# Patient Record
Sex: Male | Born: 1985 | Hispanic: No | Marital: Single | Smoking: Current some day smoker
Health system: Southern US, Community
[De-identification: ages and names within clinical notes are randomized; demographics above are authoritative.]

## PROBLEM LIST (undated history)

## (undated) DIAGNOSIS — M549 Dorsalgia, unspecified: Secondary | ICD-10-CM

## (undated) DIAGNOSIS — F419 Anxiety disorder, unspecified: Secondary | ICD-10-CM

## (undated) HISTORY — PX: CIRCUMCISION: SUR203

## (undated) HISTORY — DX: Dorsalgia, unspecified: M54.9

## (undated) HISTORY — PX: ANTERIOR CRUCIATE LIGAMENT REPAIR: SHX115

## (undated) HISTORY — PX: KNEE CARTILAGE SURGERY: SHX688

---
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
## 2013-07-08 ENCOUNTER — Ambulatory Visit: Payer: Self-pay | Admitting: Family Medicine

## 2013-07-08 VITALS — BP 130/80 | HR 96 | Temp 101.2°F | Resp 16 | Ht 74.0 in | Wt 177.0 lb

## 2013-07-08 DIAGNOSIS — R5383 Other fatigue: Secondary | ICD-10-CM

## 2013-07-08 DIAGNOSIS — R5381 Other malaise: Secondary | ICD-10-CM

## 2013-07-08 DIAGNOSIS — R112 Nausea with vomiting, unspecified: Secondary | ICD-10-CM

## 2013-07-08 MED ORDER — PROMETHAZINE HCL 25 MG PO TABS: 25.0000 mg | ORAL_TABLET | Freq: Three times a day (TID) | ORAL | Status: DC | PRN

## 2013-07-08 NOTE — Patient Instructions (Signed)

## 2013-07-08 NOTE — Progress Notes (Signed)
   Subjective:    Patient ID: Jon Obrien, male    DOB: 06-13-1985, 28 y.o.   MRN: [id number] This chart was scribed for Elvina SidleKurt Noella Kipnis, MD by Valera CastleSteven Perry, ED Scribe. This patient was seen in room 05 and the patient's care was started at 5:40 PM.  Chief Complaint  Patient presents with  . Nausea    vomiting, nausea diarrhea fever, sore throat   HPI Jon Obrien is a 28 y.o. male Pt presents with nausea, vomiting, diarrhea, fever, sore throat, and headache, onset yesterday afternoon. [facility]   He reports his symptoms started with weakness, so he took Vitamin C and took a nap. He states he woke up that evening vomiting. This morning he ate breakfast, took Ibuprofen, but vomited his meal back up. He states his fever, headache, started soon after. He has been able to keep some water down, but has not eaten much today.   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 He is originally from [place].   PCP - No primary provider on file.  Review of Systems  Constitutional: Positive for fever.  HENT: Positive for sore throat.   Gastrointestinal: Positive for nausea, vomiting and diarrhea.  Neurological: Positive for weakness and headaches.    There are no active problems to display for this patient.  Prior to Admission medications   Medication Sig Start Date End Date Taking? Authorizing Provider  anti-nausea (EMETROL) solution Take 10 mLs by mouth every 15 (fifteen) minutes as needed for nausea or vomiting.   Yes Historical Provider, MD  ibuprofen (ADVIL,MOTRIN) 200 MG tablet Take 200 mg by mouth every 6 (six) hours as needed.   Yes Historical Provider, MD   BP 130/80  Pulse 96  Temp(Src) 101.2 F (38.4 C) (Oral)  Resp 16  Ht 6\' 2"  (1.88 m)  Wt 177 lb (80.287 kg)  BMI 22.72 kg/m2  SpO2 96%     Objective:   Physical Exam CONSTITUTIONAL: Well developed/well nourished HEAD: Normocephalic/atraumatic EYES: EOMI/PERRL ENMT: Mucous  membranes moist NECK: supple no meningeal signs SPINE:entire spine nontender CV: S1/S2 noted, no murmurs/rubs/gallops noted LUNGS: Lungs are clear to auscultation bilaterally, no apparent distress ABDOMEN: soft, nontender, no rebound or guarding GU:no cva tenderness NEURO: Pt is awake/alert, moves all extremitiesx4 EXTREMITIES: pulses normal, full ROM SKIN: warm, color normal PSYCH: no abnormalities of mood noted Abdomen is soft without HSM, no masses, no guarding or rebound No jaundice Chest clear Heart: Regular, no murmur    Assessment & Plan:    Nausea with vomiting - Plan: promethazine (PHENERGAN) 25 MG tablet  Malaise  Signed, Elvina SidleKurt Krislynn Gronau, MD      I personally performed the services described in this documentation, which was scribed in my presence. The recorded information has been reviewed and is accurate.

## 2014-06-28 ENCOUNTER — Ambulatory Visit (INDEPENDENT_AMBULATORY_CARE_PROVIDER_SITE_OTHER): Payer: Self-pay | Admitting: Family Medicine

## 2014-06-28 VITALS — BP 116/62 | HR 66 | Temp 97.7°F | Resp 18 | Ht 75.0 in | Wt 182.0 lb

## 2014-06-28 DIAGNOSIS — M546 Pain in thoracic spine: Secondary | ICD-10-CM

## 2014-06-28 MED ORDER — CYCLOBENZAPRINE HCL 5 MG PO TABS: 5.0000 mg | ORAL_TABLET | Freq: Three times a day (TID) | ORAL | Status: DC | PRN

## 2014-06-28 MED ORDER — PREDNISONE 20 MG PO TABS: 40.0000 mg | ORAL_TABLET | Freq: Every day | ORAL | Status: DC

## 2014-06-28 NOTE — Patient Instructions (Signed)

## 2014-06-28 NOTE — Progress Notes (Signed)
This chart was scribed for Jon SidleKurt Quintessa Simmerman, MD by Luisa DagoPriscilla Tutu, ED Scribe. This patient was seen in room 1 and the patient's care was started at 4:14 PM.  Chief Complaint  Patient presents with  . Back Pain    upper back pain has hx of spasms today      This is a 28 y.o.male who complains of sudden onset upper back pain that started at 12:30 PM Character of pain: He describes the pain as sharp in nature Location of pain:  Bilateral paraspinal muscle pain Radiation of pain:  Pain does not radiate Onset associated with:  No associated symptoms Patient has a past history of low back pain for which   He states that 6 months ago he was seen at the ED for similar back pain and he was diagnosed with muscle spasms and prescribed muscle relaxants. Pt does not recall any recent injuries. 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 He also reports doing therapeutic exercising in the hopes of relieving his back pain, but he has not been successful.   Jon Obrien denies any urinary symptoms, bowel problems, numbness in the legs, loss of motor power. Jon Obrien had no fever.   Past Medical History  Diagnosis Date  . Back pain      Past Surgical History  Procedure Laterality Date  . Circumcision      Objective: 29 y.o  male in no acute distress. Blood pressure 116/62, pulse 66, temperature 97.7 F (36.5 C), temperature source Oral, resp. rate 18, height 6\' 3"  (1.905 m), weight 182 lb (82.555 kg), SpO2 98 %.Body mass index is 22.75 kg/(m^2). Palpation of the back reveals Mild medial tenderness to bilateral scapula CVA:  nontender Abdomen: soft, nontender. No rebound. No guarding Peripheral pulses:  DP/PT 2+ Inspection of the back: Reveals no scoliosis.  Straight-leg raising: negative Motor exam of lower extremity: No abnormal weakness. Reflexes: Symmetric and normal Skin exam: no rash, now redness, no wounds noted.   Assessment/Plan:  This chart was scribed in my presence  and reviewed by me personally.    ICD-9-CM ICD-10-CM   1. Bilateral thoracic back pain 724.1 M54.6 cyclobenzaprine (FLEXERIL) 5 MG tablet     predniSONE (DELTASONE) 20 MG tablet     Signed, Jon SidleKurt Abou Sterkel, MD

## 2014-07-26 ENCOUNTER — Ambulatory Visit (INDEPENDENT_AMBULATORY_CARE_PROVIDER_SITE_OTHER): Payer: Self-pay | Admitting: Family Medicine

## 2014-07-26 VITALS — BP 147/73 | HR 94 | Temp 98.3°F | Resp 16 | Ht 74.0 in | Wt 177.4 lb

## 2014-07-26 DIAGNOSIS — F4310 Post-traumatic stress disorder, unspecified: Secondary | ICD-10-CM

## 2014-07-26 MED ORDER — ALPRAZOLAM 0.25 MG PO TABS: 0.2500 mg | ORAL_TABLET | Freq: Two times a day (BID) | ORAL | Status: AC | PRN

## 2014-07-26 NOTE — Patient Instructions (Addendum)
Call Dr. Juliann Pulseortch L. Mann [address] [place]   [phone]    Post-traumatic Stress You have post-traumatic stress disorder (PTSD). This condition causes many different symptoms including: emotional outbursts, anxiety, sleeping problems, social withdrawal, and drug abuse. PTSD often follows a particularly traumatic event such as war, or natural disasters like hurricanes, earthquakes, or floods. It can also be seen after personal traumas such as accidents, rape, or the death of someone you love. Symptoms may be delayed for days or even years. Emotional numbing and the inability to feel your emotions, may be the earliest sign. Periods of agitation, aggression, and inability to perform ordinary tasks are common with PTSD. Nightmares and daytime memories of the trauma often bring on uncontrolled symptoms. Sufferers typically startle easily and avoid reminders of the trauma. Panic attacks, feelings of extreme guilt, and blackouts are often reported. Treatment is very helpful, especially group therapy. Healing happens when emotional traumas are shared with others who have a sympathetic ear. The [facility] have helped over 185,000 veterans with this problem. Medication is also very effective. The symptoms can become chronic and lifelong, so it is important to get help. Call your caregiver or a counselor who deals with this type of problem for further assistance. Document Released: 05/24/2004 Document Revised: 07/09/2011 Document Reviewed: 04/16/2005 [facility] Patient Information 2015 [place]. This information is not intended to replace advice given to you by your health care provider. Make sure you discuss any questions you have with your health care provider.

## 2014-07-26 NOTE — Progress Notes (Signed)
° °  Subjective:  This chart was scribed for Jon SidleKurt Lauenstein, MD by Modena JanskyAlbert Obrien, ED Scribe. This patient was seen in room 4 and the patient's care was started at 7:36 PM.   Patient ID: Jon Obrien, male    DOB: March 11, 1986, 29 y.o.   MRN: [id number] Chief Complaint  Patient presents with   Dizziness   Nausea   Anxiety   Fatigue   HPI HPI Comments: Jon Obrien is a 29 y.o. male who presents to the [facility] complaining of multiple symptoms.   He reports that the past couple of weeks he has intermittent episodes of feeling "unusual and detached". He describes his episodes of his head feeling foggy, draining, and dizzy. He reports that during these episodes he gets nervous, frustrated, and angry. He states that within the last 2-3 days, his episodes have worsened.   He states that has also been having nausea.   He reports that he has been feeling stressed.   He states that he has a prior hx of these symptoms when he was at the age of 29. He reports that then he was exposed to traumatic events from war in his country at that age.   He denies any sleep disturbance, SI, or hallucinations.   He reports no hx of drug use. 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  Past Medical History  Diagnosis Date   Back pain    Past Surgical History  Procedure Laterality Date   Circumcision     No Known Allergies Prior to Admission medications   Not on File   History   Social History   Marital Status: Single    Spouse Name: N/A   Number of Children: N/A   Years of Education: N/A   Occupational History   Not on file.   Social History Main Topics   Smoking status: Never Smoker    Smokeless tobacco: Not on file   Alcohol Use: Not on file   Drug Use: Not on file   Sexual Activity: Not on file   Other Topics  Concern   Not on file   Social History Narrative     Review of Systems  Gastrointestinal: Positive for nausea.  Neurological: Positive for dizziness.  Psychiatric/Behavioral: Negative for suicidal ideas, hallucinations and sleep disturbance. The patient is nervous/anxious.        Objective:   Physical Exam I spent 20 minutes to the patient about posttraumatic stress disorder.  Patient's having feelings of detachment, disconnectedness, and depersonalization. He's having episodes reveals a crying and then uncontrollable rage   BP 147/73 mmHg   Pulse 94   Temp(Src) 98.3 F (36.8 C) (Oral)   Resp 16   Ht 6\' 2"  (1.88 m)   Wt 177 lb 6.4 oz (80.468 kg)   BMI 22.77 kg/m2   SpO2 98%    Assessment & Plan:   This chart was scribed in my presence and reviewed by me personally.    ICD-9-CM ICD-10-CM   1. PTSD (post-traumatic stress disorder) 309.81 F43.10 ALPRAZolam (XANAX) 0.25 MG tablet   Patient referred to Dr. Davy Piqueortch Obrien here in town  (please see the patient education handout)  Signed, Jon SidleKurt Lauenstein, MD

## 2014-07-28 ENCOUNTER — Telehealth: Payer: Self-pay | Admitting: Family Medicine

## 2014-07-28 NOTE — Telephone Encounter (Signed)
Patient called and asked to speak with "Dr. Kenyon AnaKurt". Wants a call back. Would give information.   [phone]

## 2014-07-28 NOTE — Telephone Encounter (Signed)
I do not know what to do with this message

## 2014-07-28 NOTE — Telephone Encounter (Signed)
Pt states Dr L gave him a # for psychiatrist. Fredna DowSays it was wrong #.

## 2014-07-28 NOTE — Telephone Encounter (Signed)
Pt states the # you gave him for Dr Loreta AveMann is not right #. I looked # up on internet and I got the same # you gave him. Do you know what else to do?

## 2015-09-10 ENCOUNTER — Ambulatory Visit (INDEPENDENT_AMBULATORY_CARE_PROVIDER_SITE_OTHER): Payer: Self-pay | Admitting: Emergency Medicine

## 2015-09-10 VITALS — BP 114/74 | HR 77 | Temp 97.8°F | Resp 16 | Ht 74.0 in | Wt 190.2 lb

## 2015-09-10 DIAGNOSIS — J0100 Acute maxillary sinusitis, unspecified: Secondary | ICD-10-CM

## 2015-09-10 DIAGNOSIS — J029 Acute pharyngitis, unspecified: Secondary | ICD-10-CM

## 2015-09-10 DIAGNOSIS — R0982 Postnasal drip: Secondary | ICD-10-CM

## 2015-09-10 LAB — POCT RAPID STREP A (OFFICE): Rapid Strep A Screen: NEGATIVE

## 2015-09-10 MED ORDER — AMOXICILLIN 875 MG PO TABS: 875.0000 mg | ORAL_TABLET | Freq: Two times a day (BID) | ORAL | Status: DC

## 2015-09-10 NOTE — Progress Notes (Addendum)
Patient ID: Jon Obrien, male   DOB: 1985-07-09, 30 y.o.   MRN: [id number]    By signing my name below, I, Essence Howell, attest that this documentation has been prepared under the direction and in the presence of Collene GobbleSteven A Agnes Probert, MD Electronically Signed: Charline BillsEssence Howell, ED Scribe 09/10/2015 at 8:49 AM.  Chief Complaint:  Chief Complaint  Patient presents with  . runny nose    x 2 days  . Sore Throat    x 2 days  . Headache    x 2 days   HPI: Jon Obrien is a 30 y.o. male [facility] complaining of intermittent HA for the past 2 days. Pt reports associated symptoms of rhinorrhea x 2 days, postnasal drip, productive cough with yellow sputum and sore throat onset this morning that is exacerbated with swallowing. Pt's nephew is sick with strep throat. He has tried Robitussin without significant relief. He denies fever. Pt reports h/o environmental allergies. No h/o HTN or DM. No known drug allergies.   Past Medical History  Diagnosis Date  . Back pain    Past Surgical History  Procedure Laterality Date  . Circumcision     Social History   Social History  . Marital Status: Single    Spouse Name: N/A  . Number of Children: N/A  . Years of Education: N/A   Social History Main Topics  . Smoking status: Never Smoker   . Smokeless tobacco: None  . Alcohol Use: None  . Drug Use: None  . Sexual Activity: Not Asked   Other Topics Concern  . None   Social History Narrative   Family History  Problem Relation Age of Onset  . Hyperlipidemia Mother   . Cancer Paternal Grandmother    No Known Allergies Prior to Admission medications   Medication Sig Start Date End Date Taking? Authorizing Provider  ALPRAZolam (XANAX) 0.25 MG tablet Take 1 tablet (0.25 mg total) by mouth 2 (two) times daily as needed for anxiety. Patient not taking: Reported on 09/10/2015 07/26/14   Elvina SidleKurt Lauenstein, MD   ROS: The patient denies fevers, chills, night sweats, unintentional weight  loss, chest pain, palpitations, wheezing, dyspnea on exertion, nausea, vomiting, abdominal pain, dysuria, hematuria, melena, numbness, weakness, or tingling.   All other systems have been reviewed and were otherwise negative with the exception of those mentioned in the HPI and as above.    PHYSICAL EXAM: Filed Vitals:   09/10/15 0817  BP: 114/74  Pulse: 77  Temp: 97.8 F (36.6 C)  Resp: 16   Body mass index is 24.41 kg/(m^2).  General: Alert, no acute distress HEENT:  Normocephalic, atraumatic, oropharynx patent. Posterior pharynx is very red. Tonsils are not enlarged. TMs are clear.  Eye: Nonie HoyerOMI, [facility] Cardiovascular: Regular rate and rhythm, no rubs murmurs or gallops. No Carotid bruits, radial pulse intact. No pedal edema.  Respiratory: Clear to auscultation bilaterally. No wheezes, rales, or rhonchi. No cyanosis, no use of accessory musculature Abdominal: No organomegaly, abdomen is soft and non-tender, positive bowel sounds. No masses. Musculoskeletal: Gait intact. No edema, tenderness Skin: No rashes. Neurologic: Facial musculature symmetric. Psychiatric: Patient acts appropriately throughout our interaction. Lymphatic: No cervical or submandibular lymphadenopathy  LABS: Results for orders placed or performed in visit on 09/10/15  POCT rapid strep A  Result Value Ref Range   Rapid Strep A Screen Negative Negative   EKG/XRAY:   Primary [facility]  ASSESSMENT/PLAN:   Patient has had  strep exposure. He has had a yellowish nasal drainage with severe sore throat. Strep screen here was negative. Will treat with amoxicillin twice a day saltwater gargles.I personally performed the services described in this documentation, which was scribed in my presence. The recorded information has been reviewed and is accurateI told the patient if he wanted to wait 24 to  48 hours and see if his symptoms improve without antibiotics that would be very reasonable. He  will give it at least 24 hours and see if his symptoms improve and then if not he will take the antibiotics.Michaell Cowing sideeffects, risk and benefits, and alternatives of medications d/w patient. Patient is aware that all medications have potential sideeffects and we are unable to predict every sideeffect or drug-drug interaction that may occur.  Lesle Chris MD 09/10/2015 8:54 AM

## 2015-09-10 NOTE — Progress Notes (Signed)
Patient ID: Baruch Merlehat Nuon, male   DOB: 06-05-1985, 30 y.o.   MRN: [id number]    By signing my name below, I, Essence Howell, attest that this documentation has been prepared under the direction and in the presence of Collene GobbleSteven A Daub, MD Electronically Signed: Charline BillsEssence Howell, ED Scribe 09/10/2015 at 8:49 AM.  Chief Complaint:  Chief Complaint  Patient presents with   runny nose    x 2 days   Sore Throat    x 2 days   Headache    x 2 days   HPI: Keoni Arnaldo NatalRamadani is a 30 y.o. male who [facility] complaining of intermittent HA for the past 2 days. Pt reports associated symptoms of rhinorrhea x 2 days, postnasal drip, productive cough with yellow sputum and sore throat onset this morning that is exacerbated with swallowing. Pt's nephew is sick with strep throat. He has tried Robitussin without significant relief. He denies fever. Pt reports h/o environmental allergies. No h/o HTN or DM. No known drug allergies.   Past Medical History  Diagnosis Date   Back pain    Past Surgical History  Procedure Laterality Date   Circumcision     Social History   Social History   Marital Status: Single    Spouse Name: N/A   Number of Children: N/A   Years of Education: N/A   Social History Main Topics   Smoking status: Never Smoker    Smokeless tobacco: None   Alcohol Use: None   Drug Use: None   Sexual Activity: Not Asked   Other Topics Concern   None   Social History Narrative   Family History  Problem Relation Age of Onset   Hyperlipidemia Mother    Cancer Paternal Grandmother    No Known Allergies Prior to Admission medications   Medication Sig Start Date End Date Taking? Authorizing Provider  ALPRAZolam (XANAX) 0.25 MG tablet Take 1 tablet (0.25 mg total) by mouth 2 (two) times daily as needed for anxiety. Patient not taking: Reported on 09/10/2015 07/26/14   Elvina SidleKurt Lauenstein, MD   ROS: The patient denies fevers, chills, night sweats, unintentional weight  loss, chest pain, palpitations, wheezing, dyspnea on exertion, nausea, vomiting, abdominal pain, dysuria, hematuria, melena, numbness, weakness, or tingling.   All other systems have been reviewed and were otherwise negative with the exception of those mentioned in the HPI and as above.    PHYSICAL EXAM: Filed Vitals:   09/10/15 0817  BP: 114/74  Pulse: 77  Temp: 97.8 F (36.6 C)  Resp: 16   Body mass index is 24.41 kg/(m^2).  General: Alert, no acute distress HEENT:  Normocephalic, atraumatic, oropharynx patent. Posterior pharynx is very red. Tonsils are not enlarged. TMs are clear.  Eye: Nonie HoyerOMI, [facility] Cardiovascular: Regular rate and rhythm, no rubs murmurs or gallops. No Carotid bruits, radial pulse intact. No pedal edema.  Respiratory: Clear to auscultation bilaterally. No wheezes, rales, or rhonchi. No cyanosis, no use of accessory musculature Abdominal: No organomegaly, abdomen is soft and non-tender, positive bowel sounds. No masses. Musculoskeletal: Gait intact. No edema, tenderness Skin: No rashes. Neurologic: Facial musculature symmetric. Psychiatric: Patient acts appropriately throughout our interaction. Lymphatic: No cervical or submandibular lymphadenopathy  LABS: Results for orders placed or performed in visit on 09/10/15  POCT rapid strep A  Result Value Ref Range   Rapid Strep A Screen Negative Negative   EKG/XRAY:   Primary [facility]  ASSESSMENT/PLAN:   Patient has had  strep exposure. He has had a yellowish nasal drainage with severe sore throat. Strep screen here was negative. Will treat with amoxicillin twice a day saltwater gargles.I personally performed the services described in this documentation, which was scribed in my presence. The recorded information has been reviewed and is accurate.  Gross sideeffects, risk and benefits, and alternatives of medications d/w patient. Patient is aware that all medications have potential  sideeffects and we are unable to predict every sideeffect or drug-drug interaction that may occur.  Lesle Chris MD 09/10/2015 8:19 AM

## 2015-09-10 NOTE — Addendum Note (Signed)
Addended by: Lesle ChrisAUB, Deberah Adolf A on: 09/10/2015 08:59 AM   Modules accepted: Kipp BroodSmartSet

## 2015-09-10 NOTE — Patient Instructions (Addendum)
     IF you received an x-ray today, you will receive an invoice from [facility]. Please contact [facility] at [phone] with questions or concerns regarding your invoice.   IF you received labwork today, you will receive an invoice from [facility]. Please contact [facility] at [phone] with questions or concerns regarding your invoice.   Our billing staff will not be able to assist you with questions regarding bills from these companies.  You will be contacted with the lab results as soon as they are available. The fastest way to get your results is to activate your My Chart account. Instructions are located on the last page of this paperwork. If you have not heard from us regarding the results in 2 weeks, please contact this office.     Sore Throat A sore throat is pain, burning, irritation, or scratchiness of the throat. There is often pain or tenderness when swallowing or talking. A sore throat may be accompanied by other symptoms, such as coughing, sneezing, fever, and swollen neck glands. A sore throat is often the first sign of another sickness, such as a cold, flu, strep throat, or mononucleosis (commonly known as mono). Most sore throats go away without medical treatment. CAUSES  The most common causes of a sore throat include:  A viral infection, such as a cold, flu, or mono.  A bacterial infection, such as strep throat, tonsillitis, or whooping cough.  Seasonal allergies.  Dryness in the air.  Irritants, such as smoke or pollution.  Gastroesophageal reflux disease (GERD). HOME CARE INSTRUCTIONS   Only take over-the-counter medicines as directed by your caregiver.  Drink enough fluids to keep your urine clear or pale yellow.  Rest as needed.  Try using throat sprays, lozenges, or sucking on hard candy to ease any pain (if older than 4 years or as directed).  Sip warm liquids, such as broth, herbal tea, or warm water  with honey to relieve pain temporarily. You may also eat or drink cold or frozen liquids such as frozen ice pops.  Gargle with salt water (mix 1 tsp salt with 8 oz of water).  Do not smoke and avoid secondhand smoke.  Put a cool-mist humidifier in your bedroom at night to moisten the air. You can also turn on a hot shower and sit in the bathroom with the door closed for 5-10 minutes. SEEK IMMEDIATE MEDICAL CARE IF:  You have difficulty breathing.  You are unable to swallow fluids, soft foods, or your saliva.  You have increased swelling in the throat.  Your sore throat does not get better in 7 days.  You have nausea and vomiting.  You have a fever or persistent symptoms for more than 2-3 days.  You have a fever and your symptoms suddenly get worse. MAKE SURE YOU:   Understand these instructions.  Will watch your condition.  Will get help right away if you are not doing well or get worse.   This information is not intended to replace advice given to you by your health care provider. Make sure you discuss any questions you have with your health care provider.   Document Released: 05/24/2004 Document Revised: 05/07/2014 Document Reviewed: 12/23/2011 Elsevier Interactive Patient Education 2016 [employer].  

## 2015-09-11 LAB — CULTURE, GROUP A STREP: Organism ID, Bacteria: NORMAL

## 2017-03-05 ENCOUNTER — Ambulatory Visit: Payer: Self-pay | Admitting: Physician Assistant

## 2017-09-13 ENCOUNTER — Emergency Department (HOSPITAL_COMMUNITY): Payer: Self-pay

## 2017-09-13 ENCOUNTER — Emergency Department (HOSPITAL_COMMUNITY)
Admission: EM | Admit: 2017-09-13 | Discharge: 2017-09-13 | Disposition: A | Discharge status: Home / Self Care | Payer: Self-pay | Attending: Emergency Medicine | Admitting: Emergency Medicine

## 2017-09-13 ENCOUNTER — Encounter (HOSPITAL_COMMUNITY): Payer: Self-pay | Admitting: *Deleted

## 2017-09-13 ENCOUNTER — Other Ambulatory Visit: Payer: Self-pay

## 2017-09-13 DIAGNOSIS — R61 Generalized hyperhidrosis: Secondary | ICD-10-CM | POA: Insufficient documentation

## 2017-09-13 DIAGNOSIS — R0602 Shortness of breath: Secondary | ICD-10-CM | POA: Insufficient documentation

## 2017-09-13 DIAGNOSIS — R0789 Other chest pain: Secondary | ICD-10-CM | POA: Insufficient documentation

## 2017-09-13 DIAGNOSIS — F17290 Nicotine dependence, other tobacco product, uncomplicated: Secondary | ICD-10-CM | POA: Insufficient documentation

## 2017-09-13 DIAGNOSIS — R42 Dizziness and giddiness: Secondary | ICD-10-CM | POA: Insufficient documentation

## 2017-09-13 DIAGNOSIS — R110 Nausea: Secondary | ICD-10-CM | POA: Insufficient documentation

## 2017-09-13 HISTORY — DX: Anxiety disorder, unspecified: F41.9

## 2017-09-13 LAB — BASIC METABOLIC PANEL
Anion gap: 8 (ref 5–15)
BUN: 16 mg/dL (ref 6–20)
CALCIUM: 9.4 mg/dL (ref 8.9–10.3)
CO2: 29 mmol/L (ref 22–32)
Chloride: 106 mmol/L (ref 101–111)
Creatinine, Ser: 1.09 mg/dL (ref 0.61–1.24)
GFR calc Af Amer: >60 mL/min (ref >60)
GFR calc non Af Amer: >60 mL/min (ref >60)
GLUCOSE: 100 mg/dL — AB (ref 65–99)
Potassium: 4.1 mmol/L (ref 3.5–5.1)
Sodium: 143 mmol/L (ref 135–145)

## 2017-09-13 LAB — CBC
HCT: 45.1 % (ref 39.0–52.0)
Hemoglobin: 14.7 g/dL (ref 13.0–17.0)
MCH: 29.2 pg (ref 26.0–34.0)
MCHC: 32.6 g/dL (ref 30.0–36.0)
MCV: 89.7 fL (ref 78.0–100.0)
Platelets: 279 10*3/uL (ref 150–400)
RBC: 5.03 MIL/uL (ref 4.22–5.81)
RDW: 11.8 % (ref 11.5–15.5)
WBC: 7.6 10*3/uL (ref 4.0–10.5)

## 2017-09-13 LAB — I-STAT TROPONIN, ED: Troponin i, poc: 0 ng/mL (ref 0.00–0.08)

## 2017-09-13 LAB — TROPONIN I: Troponin I: <0.03 ng/mL (ref <0.03)

## 2017-09-13 MED ORDER — FAMOTIDINE 20 MG PO TABS: 20.0000 mg | ORAL_TABLET | Freq: Two times a day (BID) | ORAL | 0 refills | Status: DC

## 2017-09-13 NOTE — ED Triage Notes (Signed)
THE PT  WOKE UP 0430 WITH CENTRAL CHEST PAIN LT AND RT  WITH SL NAUSEA  NO SOB OR VOMITING HE HAD A SIMILAR EPISODE ONE WEEK AGO BUT IT WAS FLEETING  HX ANXIETY

## 2017-09-13 NOTE — ED Provider Notes (Signed)
[facility] EMERGENCY DEPARTMENT Provider Note   CSN: [id number] Arrival date & time: 09/13/17  0544     History   Chief Complaint Chief Complaint  Patient presents with  . Chest Pain    HPI Jon Obrien is a 32 y.o. male   The history is provided by the patient.  Chest Pain   This is a new problem. The current episode started 3 to 5 hours ago. The problem has been resolved. The pain is associated with rest. The pain is present in the substernal region. The pain is at a severity of 9/10. The pain is severe. The quality of the pain is described as pressure-like. The pain radiates to the upper back. Duration of episode(s) is 4 hours. The symptoms are aggravated by deep breathing. Associated symptoms include diaphoresis, dizziness, nausea and shortness of breath. Pertinent negatives include no abdominal pain, no back pain, no claudication, no cough, no exertional chest pressure, no fever, no headaches, no hemoptysis, no irregular heartbeat, no leg pain, no lower extremity edema, no malaise/fatigue, no near-syncope, no numbness, no orthopnea, no palpitations, no PND, no sputum production, no syncope, no vomiting and no weakness. Risk factors include male gender.  His family medical history is significant for early MI.    Past Medical History:  Diagnosis Date  . Anxiety   . Back pain     There are no active problems to display for this patient.   Past Surgical History:  Procedure Laterality Date  . CIRCUMCISION          Home Medications    Prior to Admission medications   Medication Sig Start Date End Date Taking? Authorizing Provider  ALPRAZolam (XANAX) 0.25 MG tablet Take 1 tablet (0.25 mg total) by mouth 2 (two) times daily as needed for anxiety. Patient not taking: Reported on 09/10/2015 07/26/14   Elvina Sidle, MD  amoxicillin (AMOXIL) 875 MG tablet Take 1 tablet (875 mg total) by mouth 2 (two) times daily. 09/10/15   Collene Gobble, MD    famotidine (PEPCID) 20 MG tablet Take 1 tablet (20 mg total) by mouth 2 (two) times daily. 09/13/17   Arthor Captain, PA-C    Family History Family History  Problem Relation Age of Onset  . Hyperlipidemia Mother   . Cancer Paternal Grandmother     Social History Social History   Tobacco Use  . Smoking status: Current Every Day Smoker    Types: Cigars  . Smokeless tobacco: Never Used  Substance Use Topics  . Alcohol use: Not Currently  . Drug use: Never     Allergies   Patient has no known allergies.   Review of Systems Review of Systems  Constitutional: Positive for diaphoresis. Negative for fever and malaise/fatigue.  Respiratory: Positive for shortness of breath. Negative for cough, hemoptysis and sputum production.   Cardiovascular: Positive for chest pain. Negative for palpitations, orthopnea, claudication, syncope, PND and near-syncope.  Gastrointestinal: Positive for nausea. Negative for abdominal pain and vomiting.  Musculoskeletal: Negative for back pain.  Neurological: Positive for dizziness. Negative for weakness, numbness and headaches.  Ten systems reviewed and are negative for acute change, except as noted in the HPI.     Physical Exam Updated Vital Signs BP (!) 112/59 (BP Location: Left Arm)   Pulse 80   Temp 98.2 F (36.8 C) (Oral)   Resp 18   Ht  (1.905 m)   Wt 83 kg (183 lb)   SpO2 99%   BMI  22.87 kg/m   Physical Exam  Constitutional: He appears well-developed and well-nourished. No distress.  HENT:  Head: Normocephalic and atraumatic.  Eyes: Conjunctivae are normal. No scleral icterus.  Neck: Normal range of motion. Neck supple.  Cardiovascular: Normal rate, regular rhythm and normal heart sounds.  Pulmonary/Chest: Effort normal and breath sounds normal. No respiratory distress.  Abdominal: Soft. There is no tenderness.  Musculoskeletal: He exhibits no edema.  Neurological: He is alert.  Skin: Skin is warm and dry. He is not  diaphoretic.  Psychiatric: His behavior is normal.  Nursing note and vitals reviewed.    ED Treatments / Results  Labs (all labs ordered are listed, but only abnormal results are displayed) Labs Reviewed  BASIC METABOLIC PANEL - Abnormal; Notable for the following components:      Result Value   Glucose, Bld 100 (*)    All other components within normal limits  CBC  TROPONIN I  I-STAT TROPONIN, ED    EKG EKG Interpretation  Date/Time:  Friday Sep 13 2017 05:50:44 EDT Ventricular Rate:  79 PR Interval:  232 QRS Duration: 86 QT Interval:  372 QTC Calculation: 426 R Axis:   70 Text Interpretation:  Sinus rhythm with 1st degree A-V block Cannot rule out Anterior infarct , age undetermined Abnormal ECG No old tracing to compare Confirmed by Shaune Pollack [phone]) on 09/13/2017 9:49:56 AM   Radiology Dg Chest 2 View  Result Date: 09/13/2017 CLINICAL DATA:  Acute onset of central chest pain and nausea. EXAM: CHEST - 2 VIEW COMPARISON:  None. FINDINGS: The lungs are well-aerated. Mild peribronchial thickening is noted. There is no evidence of focal opacification, pleural effusion or pneumothorax. The heart is normal in size; the mediastinal contour is within normal limits. No acute osseous abnormalities are seen. IMPRESSION: Mild peribronchial thickening noted; lungs otherwise clear. Electronically Signed   By: Roanna Raider M.D.   On: 09/13/2017 06:29    Procedures Procedures (including critical care time)  Medications Ordered in ED Medications - No data to display   Initial Impression / Assessment and Plan / ED Course  I have reviewed the triage vital signs and the nursing notes.  Pertinent labs & imaging results that were available during my care of the patient were reviewed by me and considered in my medical decision making (see chart for details).  Clinical Course as of Sep 14 1599  Fri Sep 13, 2017  1003 The EKG does not appear to have ischemic changes.     [AH]    1033 Patient with HEART score 2-3. He is PERC negative.   [AH]    Clinical Course User Index [AH] Arthor Captain, PA-C    Patient with low risk CP. He appears appropriate for discharge. ? Esophageal spasm or Reflux. OP acid reducer advised. Discussed follow up and return precautions.  Final Clinical Impressions(s) / ED Diagnoses   Final diagnoses:  Atypical chest pain    ED Discharge Orders        Ordered    famotidine (PEPCID) 20 MG tablet  2 times daily     09/13/17 1027       Arthor Captain, PA-C 09/13/17 1601    Shaune Pollack, MD 09/14/17 [phone]

## 2017-09-13 NOTE — Discharge Instructions (Signed)

## 2017-10-04 ENCOUNTER — Other Ambulatory Visit: Payer: Self-pay

## 2017-10-04 ENCOUNTER — Ambulatory Visit (INDEPENDENT_AMBULATORY_CARE_PROVIDER_SITE_OTHER): Payer: Self-pay | Admitting: Emergency Medicine

## 2017-10-04 ENCOUNTER — Encounter: Payer: Self-pay | Admitting: Emergency Medicine

## 2017-10-04 VITALS — BP 108/68 | HR 88 | Temp 98.3°F | Resp 16 | Ht 74.5 in | Wt 182.0 lb

## 2017-10-04 DIAGNOSIS — M7918 Myalgia, other site: Secondary | ICD-10-CM

## 2017-10-04 DIAGNOSIS — M546 Pain in thoracic spine: Secondary | ICD-10-CM | POA: Insufficient documentation

## 2017-10-04 DIAGNOSIS — M62838 Other muscle spasm: Secondary | ICD-10-CM | POA: Insufficient documentation

## 2017-10-04 MED ORDER — PREDNISONE 20 MG PO TABS: 20.0000 mg | ORAL_TABLET | Freq: Every day | ORAL | 0 refills | Status: AC

## 2017-10-04 MED ORDER — CYCLOBENZAPRINE HCL 10 MG PO TABS: 10.0000 mg | ORAL_TABLET | Freq: Three times a day (TID) | ORAL | 0 refills | Status: DC | PRN

## 2017-10-04 NOTE — Progress Notes (Signed)
Jon Obrien 32 y.o.   Chief Complaint  Patient presents with  . Back Pain    upper mid back - dx with spasms in the past denies CP at this time    HISTORY OF PRESENT ILLNESS: This is a 32 y.o. male complaining of muscle spasms to his mid back that started a couple days ago.  Probably related to some recent heavy lifting that he did.  No other significant symptoms.  HPI   Prior to Admission medications   Medication Sig Start Date End Date Taking? Authorizing Provider  ALPRAZolam (XANAX) 0.25 MG tablet Take 1 tablet (0.25 mg total) by mouth 2 (two) times daily as needed for anxiety. Patient not taking: Reported on 09/10/2015 07/26/14   Elvina SidleLauenstein, Kurt, MD  amoxicillin (AMOXIL) 875 MG tablet Take 1 tablet (875 mg total) by mouth 2 (two) times daily. 09/10/15   Collene Gobbleaub, Steven A, MD  famotidine (PEPCID) 20 MG tablet Take 1 tablet (20 mg total) by mouth 2 (two) times daily. Patient not taking: Reported on 10/04/2017 09/13/17   Arthor CaptainHarris, Abigail, PA-C    No Known Allergies  There are no active problems to display for this patient.   Past Medical History:  Diagnosis Date  . Anxiety   . Back pain     Past Surgical History:  Procedure Laterality Date  . CIRCUMCISION      Social History   Socioeconomic History  . Marital status: Single    Spouse name: Not on file  . Number of children: Not on file  . Years of education: Not on file  . Highest education level: Not on file  Occupational History  . Not on file  Social Needs  . Financial resource strain: Not on file  . Food insecurity:    Worry: Not on file    Inability: Not on file  . Transportation needs:    Medical: Not on file    Non-medical: Not on file  Tobacco Use  . Smoking status: Current Every Day Smoker    Packs/day: 0.25    Types: Cigars  . Smokeless tobacco: Never Used  . Tobacco comment: 1 once a month  Substance and Sexual Activity  . Alcohol use: Not Currently  . Drug use: Never  . Sexual activity: Not  on file  Lifestyle  . Physical activity:    Days per week: Not on file    Minutes per session: Not on file  . Stress: Not on file  Relationships  . Social connections:    Talks on phone: Not on file    Gets together: Not on file    Attends religious service: Not on file    Active member of club or organization: Not on file    Attends meetings of clubs or organizations: Not on file    Relationship status: Not on file  . Intimate partner violence:    Fear of current or ex partner: Not on file    Emotionally abused: Not on file    Physically abused: Not on file    Forced sexual activity: Not on file  Other Topics Concern  . Not on file  Social History Narrative  . Not on file    Family History  Problem Relation Age of Onset  . Hyperlipidemia Mother   . Cancer Paternal Grandmother      Review of Systems  Constitutional: Negative.  Negative for chills and fever.  HENT: Negative.  Negative for sore throat.   Eyes: Negative.  Respiratory: Negative.  Negative for cough and shortness of breath.   Cardiovascular: Negative.  Negative for chest pain and palpitations.  Gastrointestinal: Negative.  Negative for abdominal pain, nausea and vomiting.  Genitourinary: Negative.  Negative for dysuria and hematuria.  Musculoskeletal: Positive for back pain.  Skin: Negative.  Negative for rash.  Neurological: Negative.  Negative for dizziness and headaches.  Endo/Heme/Allergies: Negative.   All other systems reviewed and are negative.   Vitals:   10/04/17 1159  BP: 108/68  Pulse: 88  Resp: 16  Temp: 98.3 F (36.8 C)  SpO2: 98%    Physical Exam  Constitutional: He is oriented to person, place, and time. He appears well-developed and well-nourished.  HENT:  Head: Normocephalic and atraumatic.  Eyes: Pupils are equal, round, and reactive to light.  Neck: Normal range of motion.  Cardiovascular: Normal rate and regular rhythm.  Pulmonary/Chest: Effort normal and breath sounds  normal.  Abdominal: Soft. There is no tenderness.  Musculoskeletal:  Positive tenderness with spasm to bilateral thoracic spine area.  Neurological: He is alert and oriented to person, place, and time.  Skin: Skin is warm and dry. Capillary refill takes less than 2 seconds.  Psychiatric: He has a normal mood and affect. His behavior is normal.  Vitals reviewed.    ASSESSMENT & PLAN: Shahzad was seen today for back pain.  Diagnoses and all orders for this visit:  Acute bilateral thoracic back pain  Musculoskeletal pain -     cyclobenzaprine (FLEXERIL) 10 MG tablet; Take 1 tablet (10 mg total) by mouth 3 (three) times daily as needed for muscle spasms. -     predniSONE (DELTASONE) 20 MG tablet; Take 1 tablet (20 mg total) by mouth daily with breakfast for 5 days.  Muscle spasm -     cyclobenzaprine (FLEXERIL) 10 MG tablet; Take 1 tablet (10 mg total) by mouth 3 (three) times daily as needed for muscle spasms. -     predniSONE (DELTASONE) 20 MG tablet; Take 1 tablet (20 mg total) by mouth daily with breakfast for 5 days.    Patient Instructions       IF you received an x-ray today, you will receive an invoice from [facility]. Please contact [facility] at [phone] with questions or concerns regarding your invoice.   IF you received labwork today, you will receive an invoice from [place]. Please contact [facility] at [phone] with questions or concerns regarding your invoice.   Our billing staff will not be able to assist you with questions regarding bills from these companies.  You will be contacted with the lab results as soon as they are available. The fastest way to get your results is to activate your My Chart account. Instructions are located on the last page of this paperwork. If you have not heard from [place] regarding the results in 2 weeks, please contact this office.      Muscle Cramps and Spasms Muscle cramps and spasms are when muscles  tighten by themselves. They usually get better within minutes. Muscle cramps are painful. They are usually stronger and last longer than muscle spasms. Muscle spasms may or may not be painful. They can last a few seconds or much longer. Follow these instructions at home:  Drink enough fluid to keep your pee (urine) clear or pale yellow.  Massage, stretch, and relax the muscle.  If directed, apply heat to tight or tense muscles as often as told by your doctor. Use the heat source that your doctor  recommends. ? Place a towel between your skin and the heat source. ? Leave the heat on for 20-30 minutes. ? Take off the heat if your skin turns bright red. This is especially important if you are unable to feel pain, heat, or cold. You may have a greater risk of getting burned.  If directed, put ice on the affected area. This may help if you are sore or have pain after a cramp or spasm. ? Put ice in a plastic bag. ? Place a towel between your skin and the bag. ? Leave the ice on for 20 minutes, 2-3 times a day.  Take over-the-counter and prescription medicines only as told by your doctor.  Pay attention to any changes in your symptoms. Contact a doctor if:  Your cramps or spasms get worse or happen more often.  Your cramps or spasms do not get better with time. This information is not intended to replace advice given to you by your health care provider. Make sure you discuss any questions you have with your health care provider. Document Released: 03/29/2008 Document Revised: 05/18/2015 Document Reviewed: 01/18/2015 Elsevier Interactive Patient Education  2018 Elsevier Inc.      Edwina Barth, MD [facility]

## 2017-10-04 NOTE — Patient Instructions (Addendum)
     IF you received an x-ray today, you will receive an invoice from [facility]. Please contact [facility] at [phone] with questions or concerns regarding your invoice.   IF you received labwork today, you will receive an invoice from [facility]. Please contact [facility] at [phone] with questions or concerns regarding your invoice.   Our billing staff will not be able to assist you with questions regarding bills from these companies.  You will be contacted with the lab results as soon as they are available. The fastest way to get your results is to activate your My Chart account. Instructions are located on the last page of this paperwork. If you have not heard from us regarding the results in 2 weeks, please contact this office.     Muscle Cramps and Spasms Muscle cramps and spasms are when muscles tighten by themselves. They usually get better within minutes. Muscle cramps are painful. They are usually stronger and last longer than muscle spasms. Muscle spasms may or may not be painful. They can last a few seconds or much longer. Follow these instructions at home:  Drink enough fluid to keep your pee (urine) clear or pale yellow.  Massage, stretch, and relax the muscle.  If directed, apply heat to tight or tense muscles as often as told by your doctor. Use the heat source that your doctor recommends. ? Place a towel between your skin and the heat source. ? Leave the heat on for 20-30 minutes. ? Take off the heat if your skin turns bright red. This is especially important if you are unable to feel pain, heat, or cold. You may have a greater risk of getting burned.  If directed, put ice on the affected area. This may help if you are sore or have pain after a cramp or spasm. ? Put ice in a plastic bag. ? Place a towel between your skin and the bag. ? Leave the ice on for 20 minutes, 2-3 times a day.  Take over-the-counter and prescription medicines only as  told by your doctor.  Pay attention to any changes in your symptoms. Contact a doctor if:  Your cramps or spasms get worse or happen more often.  Your cramps or spasms do not get better with time. This information is not intended to replace advice given to you by your health care provider. Make sure you discuss any questions you have with your health care provider. Document Released: 03/29/2008 Document Revised: 05/18/2015 Document Reviewed: 01/18/2015 Elsevier Interactive Patient Education  2018 Elsevier Inc.  

## 2019-05-07 ENCOUNTER — Other Ambulatory Visit: Payer: Self-pay | Admitting: Family Medicine

## 2019-05-07 DIAGNOSIS — K4090 Unilateral inguinal hernia, without obstruction or gangrene, not specified as recurrent: Secondary | ICD-10-CM

## 2019-05-07 DIAGNOSIS — R109 Unspecified abdominal pain: Secondary | ICD-10-CM

## 2019-05-11 ENCOUNTER — Ambulatory Visit (INDEPENDENT_AMBULATORY_CARE_PROVIDER_SITE_OTHER): Payer: No Typology Code available for payment source

## 2019-05-11 ENCOUNTER — Other Ambulatory Visit: Payer: Self-pay | Admitting: Family Medicine

## 2019-05-11 ENCOUNTER — Other Ambulatory Visit: Payer: Self-pay

## 2019-05-11 DIAGNOSIS — R109 Unspecified abdominal pain: Secondary | ICD-10-CM

## 2019-05-11 DIAGNOSIS — K4090 Unilateral inguinal hernia, without obstruction or gangrene, not specified as recurrent: Secondary | ICD-10-CM | POA: Diagnosis not present

## 2019-05-13 ENCOUNTER — Other Ambulatory Visit: Payer: No Typology Code available for payment source

## 2019-05-14 ENCOUNTER — Other Ambulatory Visit: Payer: No Typology Code available for payment source

## 2019-05-20 ENCOUNTER — Other Ambulatory Visit: Payer: Self-pay | Admitting: Family Medicine

## 2019-05-20 DIAGNOSIS — R109 Unspecified abdominal pain: Secondary | ICD-10-CM

## 2019-05-21 ENCOUNTER — Other Ambulatory Visit: Payer: Self-pay

## 2019-05-21 ENCOUNTER — Ambulatory Visit (INDEPENDENT_AMBULATORY_CARE_PROVIDER_SITE_OTHER): Payer: No Typology Code available for payment source

## 2019-05-21 DIAGNOSIS — R109 Unspecified abdominal pain: Secondary | ICD-10-CM | POA: Diagnosis not present

## 2020-08-04 IMAGING — CT CT PELVIS W/O CM
2 of 5 series · 16 of 46 positions shown, 19 images · non-contrast
Comparison: None.

CLINICAL DATA: Lower pelvic discomfort. Suspected inguinal hernia.

EXAM:
CT PELVIS WITHOUT CONTRAST
TECHNIQUE: Multidetector CT imaging of the pelvis was performed following the
standard protocol without intravenous contrast.

[Series 3: axial soft · axial · 0.82mm/px · z∈[-358,-68]mm · 13 of 162 slices shown, 16 images]
[im 11/162  soft-tissue]
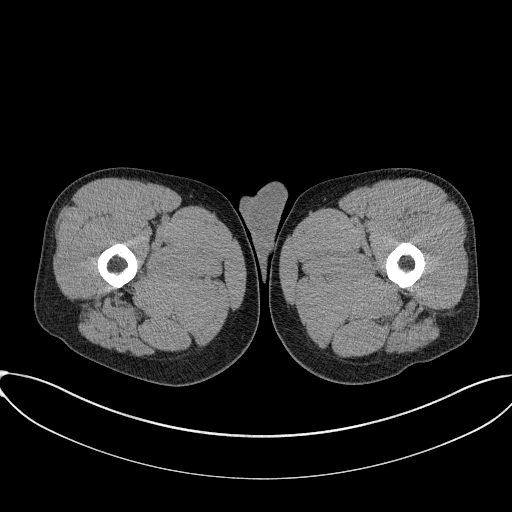
[im 11/162  bone]
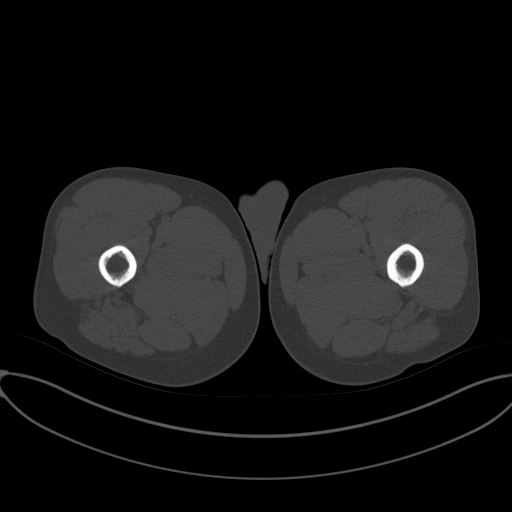
[im 26/162  soft-tissue]
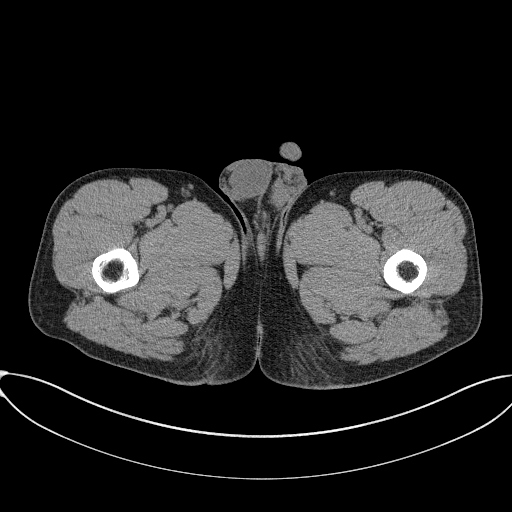
[im 42/162  soft-tissue]
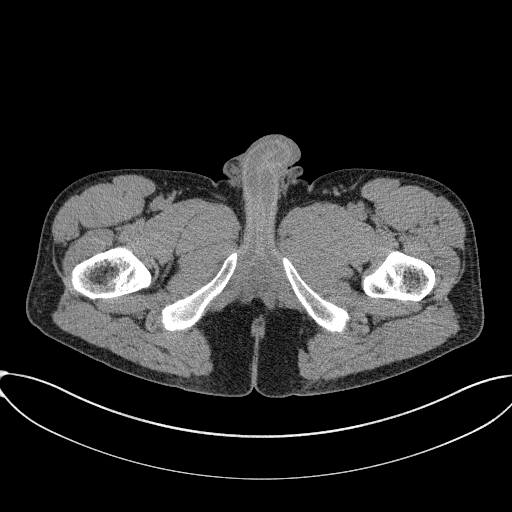
[im 58/162  soft-tissue]
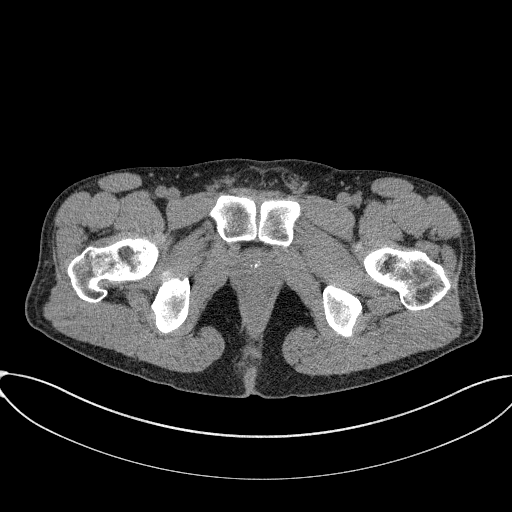
[im 73/162  soft-tissue]
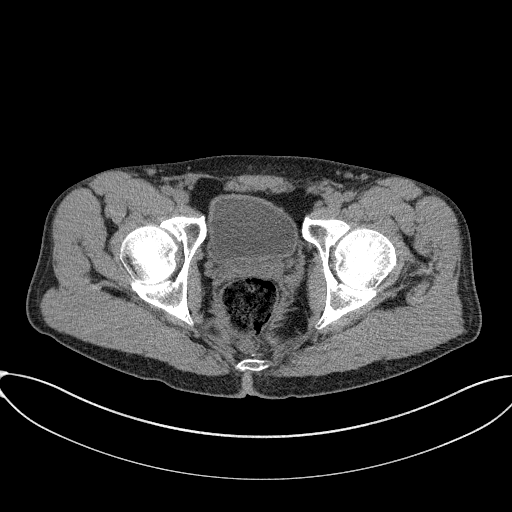
[im 89/162  soft-tissue]
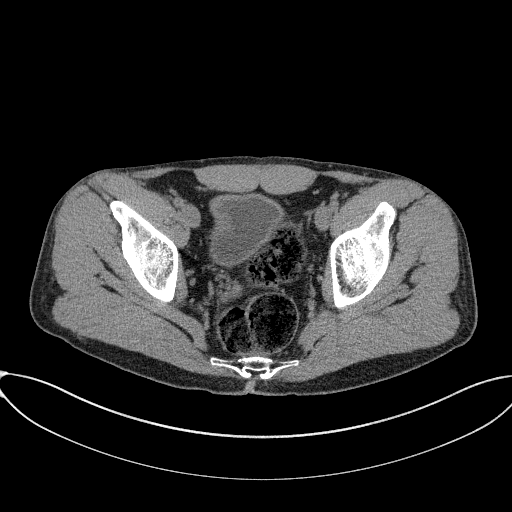
[im 104/162  soft-tissue]
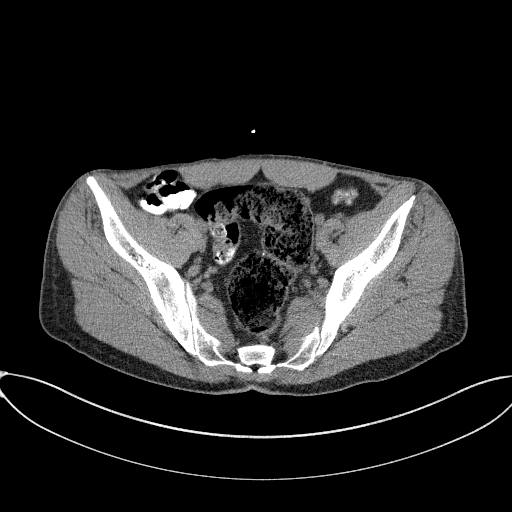
[im 120/162  soft-tissue]
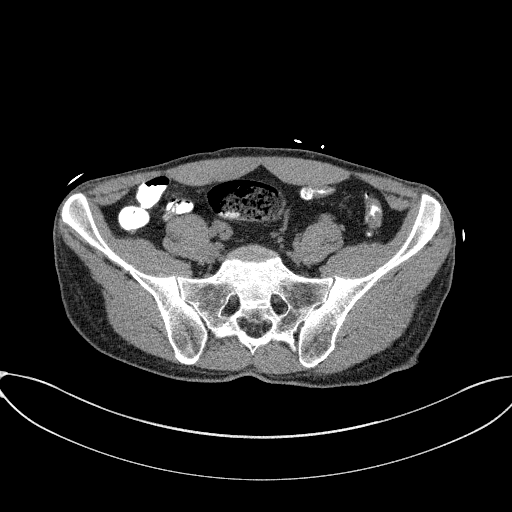
[im 136/162  soft-tissue]
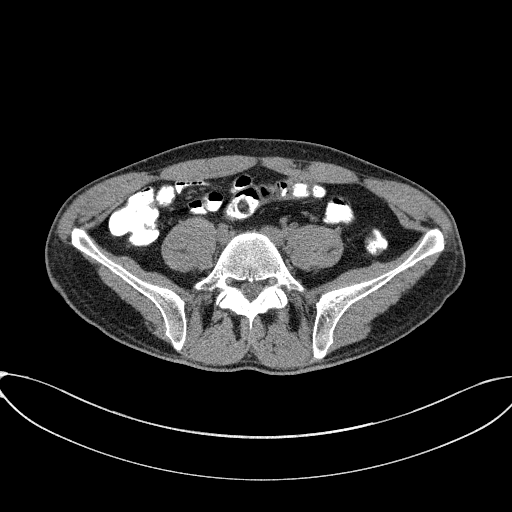
[im 136/162  bone]
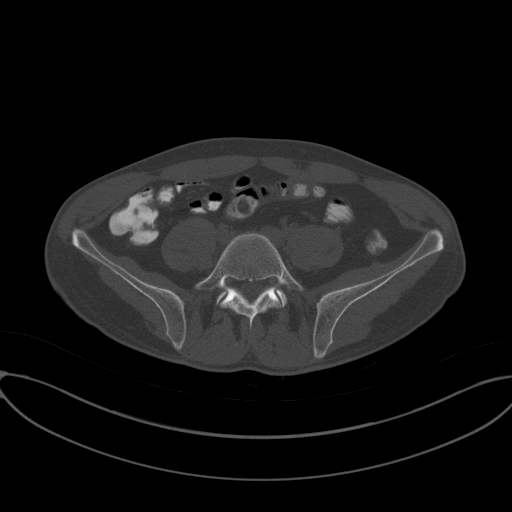
[im 141/162  lung]
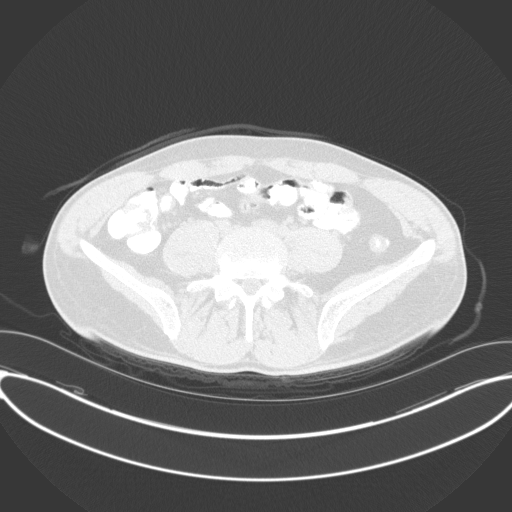
[im 146/162  lung]
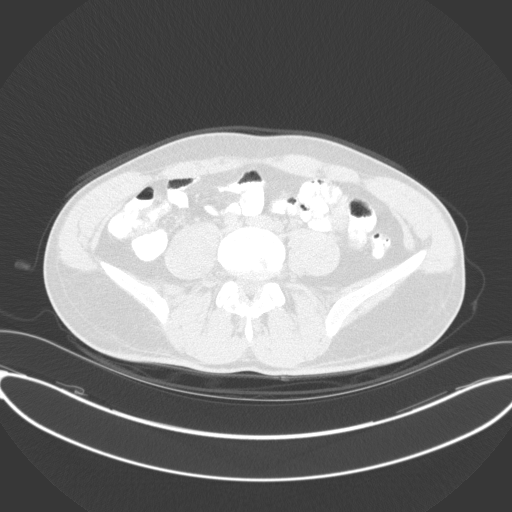
[im 151/162  soft-tissue]
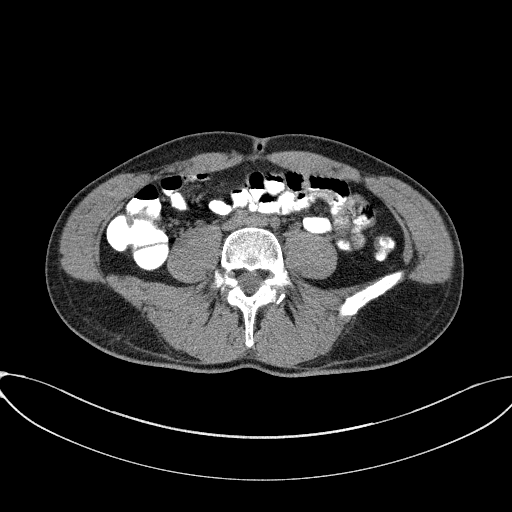
[im 151/162  lung]
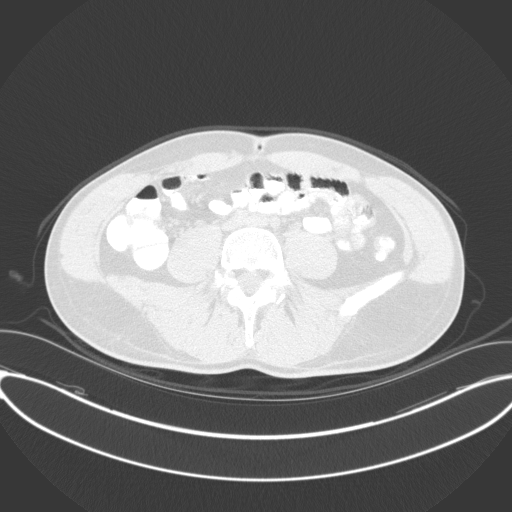
[im 156/162  lung]
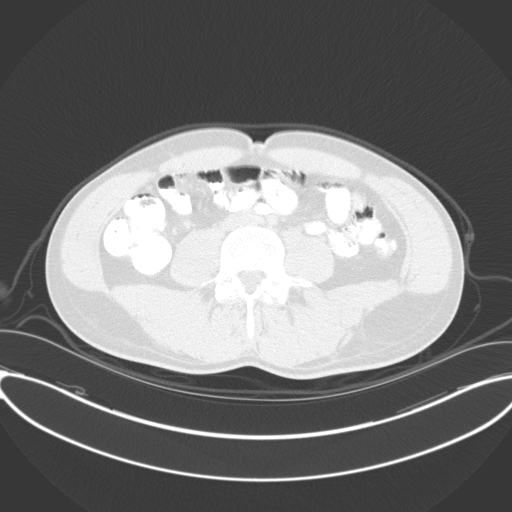

[Series 6: coronal st · coronal · 0.65mm/px · 3 of 110 slices shown]
[im 22/110  soft-tissue]
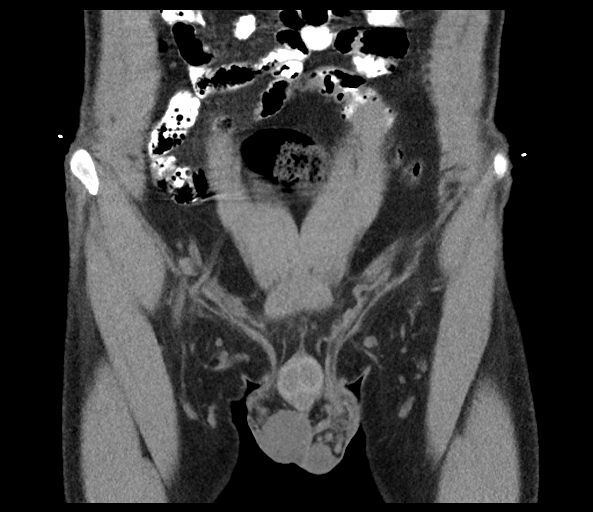
[im 44/110  soft-tissue]
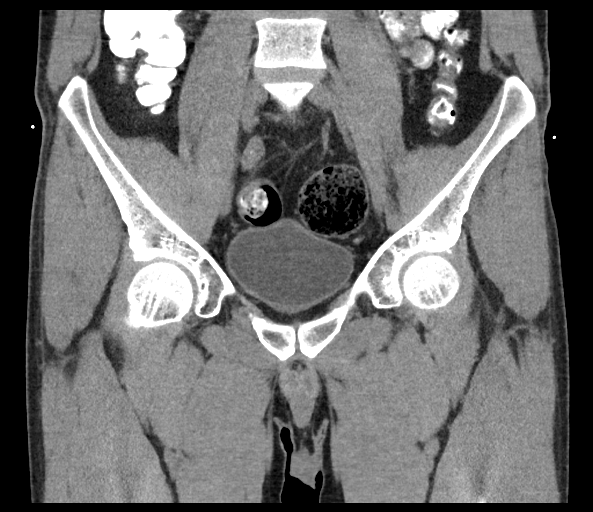
[im 66/110  soft-tissue]
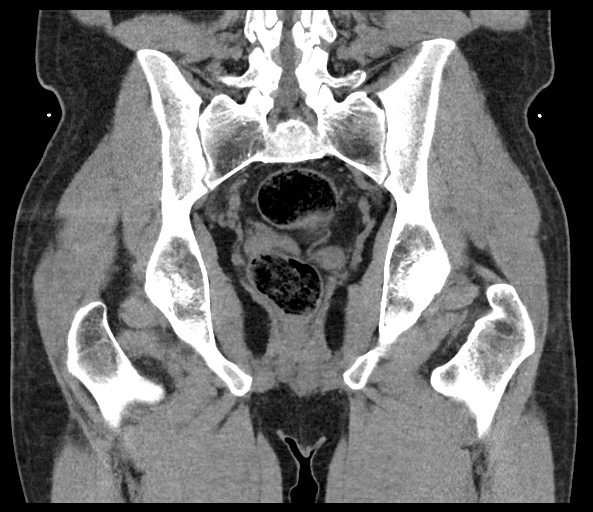

[16 of 46 positions shown; findings below may reference images not displayed]

FINDINGS: Lower Urinary Tract: Unremarkable urinary bladder.

Bowel: Unremarkable pelvic bowel loops.

Vascular/Lymphatic: No pathologically enlarged lymph nodes or other
significant abnormality.

Reproductive:  No mass or other significant abnormality.

Other: No evidence of inguinal hernia, mass, or lymphadenopathy.

Musculoskeletal: No significant abnormality identified.
IMPRESSION: Negative. No evidence of inguinal hernia or other significant
abnormality.

## 2021-11-13 NOTE — Progress Notes (Signed)
[name], DOB 08-Sep-1985, MRN [id number]  PCP:  Patient, No Pcp Per  Cardiologist:  Rex Kras, DO, [facility]  (established care 11/14/2021)  REASON FOR CONSULT: Chest pain/palpitations  REQUESTING PHYSICIAN:  Kathyrn Lass, MD [place],  [place] [id number]  Chief Complaint  Patient presents with   Palpitations   New Patient (Initial Visit)    HPI  Jon Obrien is a 36 y.o. [place] male who presents to the clinic for evaluation of palpitations/chest discomfort at the request of Kathyrn Lass, MD. His past medical history and cardiovascular risk factors include: anxiety.   Approximately 2 years ago patient had repair of the ACL/meniscus and since then his overall physical endurance has reduced.  Recently he started noticing feeling " off rhythm/off beat" with increased physical activity.  The overall intensity, frequency, and duration has not changed over the last couple years.  He is attributing these symptoms to possibly being deconditioned.  No documented syncopal events.  Chest discomfort: When he increase his physical activity such as running he started noticing left-sided chest pain, intensity 2 out of 10, describes it as a tightness like sensation, no improving or worsening factors, resolves within a few seconds/minute, present for the last couple years, no change in intensity frequency or duration.  When he is at the gym lifting weights past times he has experienced near syncope.  He takes a pause and the symptoms resolved.  He has never had a syncopal event in the recent past.  Growing up the patient has enjoyed playing sports and being physically active.  No family history of premature coronary artery disease or sudden cardiac death.  FUNCTIONAL STATUS: No structured exercise program or daily routine.   ALLERGIES: No Known Allergies  MEDICATION LIST PRIOR TO VISIT: Current Meds  Medication Sig   ALPRAZolam (XANAX) 0.25 MG tablet Take 1 tablet  (0.25 mg total) by mouth 2 (two) times daily as needed for anxiety.     PAST MEDICAL HISTORY: Past Medical History:  Diagnosis Date   Anxiety    Back pain     PAST SURGICAL HISTORY: Past Surgical History:  Procedure Laterality Date   ANTERIOR CRUCIATE LIGAMENT REPAIR Right    CIRCUMCISION     KNEE CARTILAGE SURGERY Right     FAMILY HISTORY: The patient family history includes Cancer in his paternal grandmother; Hyperlipidemia in his mother.  SOCIAL HISTORY:  The patient  reports that he has been smoking cigars. He has never used smokeless tobacco. He reports that he does not currently use alcohol. He reports that he does not use drugs.  REVIEW OF SYSTEMS: Review of Systems  Cardiovascular:  Positive for chest pain (See HPI). Negative for claudication, dyspnea on exertion, irregular heartbeat, leg swelling, near-syncope, orthopnea, palpitations, paroxysmal nocturnal dyspnea and syncope.       Feeling heart is off beat/off rhythm  Respiratory:  Negative for shortness of breath.   Hematologic/Lymphatic: Negative for bleeding problem.  Musculoskeletal:  Negative for muscle cramps and myalgias.  Neurological:  Negative for dizziness and light-headedness.   PHYSICAL EXAM:    11/14/2021    2:43 PM 10/04/2017   11:59 AM 09/13/2017   10:35 AM  Vitals with BMI  Height 6' 2"  6' 2.5"   Weight 185 lbs 182 lbs   BMI 55.73 22.02   Systolic 542 706 237  Diastolic 81 68 59  Pulse 79 88 80    Physical Exam  Neck: No JVD present.  Cardiovascular: Normal rate,  regular rhythm, S1 normal, S2 normal, intact distal pulses and normal pulses. Exam reveals no gallop, no S3 and no S4.  No murmur heard. Pulmonary/Chest: Effort normal and breath sounds normal. No stridor.  Abdominal: Soft. Bowel sounds are normal. He exhibits no distension. There is no abdominal tenderness.  Musculoskeletal:        General: No edema.   CARDIAC DATABASE: EKG: 11/14/2021: Normal sinus rhythm, 69 bpm, RSR prime,  without underlying ischemia injury pattern  Echocardiogram: No results found for this or any previous visit from the past 1095 days.    Stress Testing: No results found for this or any previous visit from the past 1095 days.   Heart Catheterization: None  LABORATORY DATA: External Labs:  Date Collected: 08/09/2021 , information obtained by referring provider Potassium: 5.2 BUN 21, creatinine 0.94 mg/dL. eGFR: 108 mL/min per 1.73 m Hemoglobin: 14.9 g/dL and hematocrit: 44.0 % Lipid profile: Total cholesterol 149 , triglycerides 34 , HDL 62 , LDL 79, non-HDL 87 AST: 21 , ALT: 23 , alkaline phosphatase: 45  TSH: 1.77  IMPRESSION:    ICD-10-CM   1. Precordial pain  R07.2 PCV CARDIAC STRESS TEST    PCV ECHOCARDIOGRAM COMPLETE    2. Palpitations  R00.2 EKG 12-Lead       RECOMMENDATIONS: Jon Obrien is a 36 y.o. Caucasian  male whose past medical history and cardiac risk factors include: Anxiety.  Patient complains of symptoms of precordial pain which appears to be cardiac discomfort based on symptoms but due to no significant risk factors overall low pretest probability of CAD is low.  Shared decision was to proceed with an echocardiogram to evaluate for LVEF and structural heart disease and exercise treadmill stress test to evaluate for functional capacity and exercise-induced ischemia.  With regards to his palpitations his symptoms are overall stable and infrequent and diagnostic yield of a Holter monitor is low.  Therefore the shared decision was to monitor his symptoms for now and if the symptoms increase in intensity frequency or duration could consider a extended Holter monitor for further evaluation.    Patient is agreeable with the plan of care. Further recommendations to follow.  FINAL MEDICATION LIST END OF ENCOUNTER: No orders of the defined types were placed in this encounter.   Medications Discontinued During This Encounter  Medication Reason   famotidine  (PEPCID) 20 MG tablet    cyclobenzaprine (FLEXERIL) 10 MG tablet    amoxicillin (AMOXIL) 875 MG tablet      Current Outpatient Medications:    ALPRAZolam (XANAX) 0.25 MG tablet, Take 1 tablet (0.25 mg total) by mouth 2 (two) times daily as needed for anxiety., Disp: 20 tablet, Rfl: 2  Orders Placed This Encounter  Procedures   PCV CARDIAC STRESS TEST   EKG 12-Lead   PCV ECHOCARDIOGRAM COMPLETE    There are no Patient Instructions on file for this visit.   --Continue cardiac medications as reconciled in final medication list. --Return in about 7 weeks (around 01/02/2022) for Reevaluation of symptoms and discuss test results. . or sooner if needed. --Continue follow-up with your primary care physician regarding the management of your other chronic comorbid conditions.  Patient's questions and concerns were addressed to his satisfaction. He voices understanding of the instructions provided during this encounter.   This note was created using a voice recognition software as a result there may be grammatical errors inadvertently enclosed that do not reflect the nature of this encounter. Every attempt is made to correct such  errors.  Rex Kras, [place], [facility]  Pager: [phone] Office: [phone]

## 2021-11-14 ENCOUNTER — Ambulatory Visit: Payer: Self-pay | Admitting: Cardiology

## 2021-11-14 ENCOUNTER — Encounter: Payer: Self-pay | Admitting: Cardiology

## 2021-11-14 VITALS — BP 117/81 | HR 79 | Temp 98.0°F | Resp 16 | Ht 74.0 in | Wt 185.0 lb

## 2021-11-14 DIAGNOSIS — R072 Precordial pain: Secondary | ICD-10-CM

## 2021-11-14 DIAGNOSIS — R002 Palpitations: Secondary | ICD-10-CM

## 2021-12-08 ENCOUNTER — Ambulatory Visit: Payer: Self-pay

## 2021-12-08 DIAGNOSIS — R072 Precordial pain: Secondary | ICD-10-CM

## 2021-12-13 NOTE — Progress Notes (Signed)
Patient called back. I have discussed the results with patient.

## 2021-12-13 NOTE — Progress Notes (Signed)
Called pt no answer, left a vm

## 2021-12-29 ENCOUNTER — Ambulatory Visit: Payer: Self-pay

## 2021-12-29 DIAGNOSIS — R072 Precordial pain: Secondary | ICD-10-CM

## 2022-01-04 ENCOUNTER — Ambulatory Visit: Payer: Self-pay | Admitting: Cardiology

## 2022-01-18 ENCOUNTER — Ambulatory Visit: Payer: Self-pay | Admitting: Cardiology

## 2022-01-18 ENCOUNTER — Encounter: Payer: Self-pay | Admitting: Cardiology

## 2022-01-18 VITALS — BP 108/77 | HR 67 | Temp 98.1°F | Resp 16 | Ht 74.0 in | Wt 186.0 lb

## 2022-01-18 DIAGNOSIS — Z712 Person consulting for explanation of examination or test findings: Secondary | ICD-10-CM

## 2022-01-18 DIAGNOSIS — R072 Precordial pain: Secondary | ICD-10-CM

## 2022-01-18 NOTE — Progress Notes (Signed)
[name], DOB 1985/09/10, MRN [id number]  PCP:  Patient, No Pcp Per  Cardiologist:  Rex Kras, DO, FACC  (established care 11/14/2021)  Date: 01/18/22 Last Office Visit: 11/14/2021  Chief Complaint  Patient presents with   Follow-up    After chest pain work-up and discuss test results    HPI  Jon Obrien is a 36 y.o. [place] male without any significant past cardiovascular history.    Was seen in the office back in July 2023 for evaluation of precordial discomfort.  His symptoms appear to be noncardiac and he had no significant risk factors.  He was informed that his pretest probability of CAD is likely to be low.  However this shared decision was to proceed with echo and exercise treadmill stress test.  Echocardiogram notes preserved LVEF without any significant valvular heart disease and GXT was overall low risk study.  Since last office visit, no longer is experiencing precordial discomfort.  And fluttering/palpitations has improved significantly.  Growing up the patient has enjoyed playing sports and being physically active.  No family history of premature coronary artery disease or sudden cardiac death.  FUNCTIONAL STATUS: No structured exercise program or daily routine.   ALLERGIES: No Known Allergies  MEDICATION LIST PRIOR TO VISIT: Current Meds  Medication Sig   ALPRAZolam (XANAX) 0.25 MG tablet Take 1 tablet (0.25 mg total) by mouth 2 (two) times daily as needed for anxiety.     PAST MEDICAL HISTORY: Past Medical History:  Diagnosis Date   Anxiety    Back pain     PAST SURGICAL HISTORY: Past Surgical History:  Procedure Laterality Date   ANTERIOR CRUCIATE LIGAMENT REPAIR Right    CIRCUMCISION     KNEE CARTILAGE SURGERY Right     FAMILY HISTORY: The patient family history includes Cancer in his paternal grandmother; Hyperlipidemia in his mother.  SOCIAL HISTORY:  The patient  reports that he has been smoking cigars. He has never used  smokeless tobacco. He reports that he does not currently use alcohol. He reports that he does not use drugs.  REVIEW OF SYSTEMS: Review of Systems  Cardiovascular:  Negative for chest pain, claudication, dyspnea on exertion, irregular heartbeat, leg swelling, near-syncope, orthopnea, palpitations, paroxysmal nocturnal dyspnea and syncope.  Respiratory:  Negative for shortness of breath.   Hematologic/Lymphatic: Negative for bleeding problem.  Musculoskeletal:  Negative for muscle cramps and myalgias.  Neurological:  Negative for dizziness and light-headedness.   PHYSICAL EXAM:    01/18/2022   11:27 AM 11/14/2021    2:43 PM 10/04/2017   11:59 AM  Vitals with BMI  Height 6' 2"  6' 2"  6' 2.5"  Weight 186 lbs 185 lbs 182 lbs  BMI 23.87 12.87 86.76  Systolic 720 947 096  Diastolic 77 81 68  Pulse 67 79 88    Physical Exam  Neck: No JVD present.  Cardiovascular: Normal rate, regular rhythm, S1 normal, S2 normal, intact distal pulses and normal pulses. Exam reveals no gallop, no S3 and no S4.  No murmur heard. Pulmonary/Chest: Effort normal and breath sounds normal. No stridor.  Abdominal: Soft. Bowel sounds are normal. He exhibits no distension. There is no abdominal tenderness.  Musculoskeletal:        General: No edema.   CARDIAC DATABASE: EKG: 11/14/2021: Normal sinus rhythm, 69 bpm, RSR prime, without underlying ischemia injury pattern  Echocardiogram: 12/08/2021: Left ventricle cavity is normal in size wall thickness. Normal global wall motion. Normal LV systolic function with EF 68%.  Normal diastolic filling pattern.  No significant valvular abnormality.  Normal right atrial pressure. Normal echocardiogram.   Stress Testing: Exercise treadmill stress test 12/29/2021: Exercise treadmill stress test performed using Bruce protocol. Patient reached 10.1 METS, and 91% of age predicted maximum heart rate. Exercise capacity was good. No chest pain reported. Normal heart rate and  hemodynamic response. Stress EKG revealed no ischemic changes. Low risk study.  Heart Catheterization: None  LABORATORY DATA: External Labs:  Date Collected: 08/09/2021 , information obtained by referring provider Potassium: 5.2 BUN 21, creatinine 0.94 mg/dL. eGFR: 108 mL/min per 1.73 m Hemoglobin: 14.9 g/dL and hematocrit: 44.0 % Lipid profile: Total cholesterol 149 , triglycerides 34 , HDL 62 , LDL 79, non-HDL 87 AST: 21 , ALT: 23 , alkaline phosphatase: 45  TSH: 1.77  IMPRESSION:    ICD-10-CM   1. Precordial pain  R07.2     2. Encounter to discuss test results  Z71.2         RECOMMENDATIONS: Jon Obrien is a 36 y.o. Caucasian  male whose past medical history and cardiac risk factors include: Anxiety.  His precordial discomfort in the past appears to be noncardiac based on characteristics; however, given his overall concern the shared decision was to proceed with echo and exercise treadmill stress test.  Echocardiogram illustrates preserved LVEF without any significant valvular heart disease and GXT overall low risk study.  Since last visit patient is doing well clinically and has not had any precordial discomfort and palpitations have improved.  His frequency of palpitations are still less that the diagnostic yield of Zio patch would be low.  I have asked him to consider other modalities such as smart watches that are FDA approved and have EKG trackers or kardia mobile.   Given his age his maximum predicted heart rate is 184 bpm.  I have asked him not to exceed during exercising.   If and when he does have palpitations certain maneuvers that he could consider are bearing down, splashing cold water on his face, pushing against a wall, etc.  He is asked to seek medical attention if his symptoms do not respond to these maneuvers or if he ever experiences syncope.  Patient verbalizes understanding and is thankful for the current work-up.  I will see him back on  as-needed basis or sooner if needed.  FINAL MEDICATION LIST END OF ENCOUNTER: No orders of the defined types were placed in this encounter.   There are no discontinued medications.    Current Outpatient Medications:    ALPRAZolam (XANAX) 0.25 MG tablet, Take 1 tablet (0.25 mg total) by mouth 2 (two) times daily as needed for anxiety., Disp: 20 tablet, Rfl: 2  No orders of the defined types were placed in this encounter.   There are no Patient Instructions on file for this visit.   --Continue cardiac medications as reconciled in final medication list. --Return if symptoms worsen or fail to improve. or sooner if needed. --Continue follow-up with your primary care physician regarding the management of your other chronic comorbid conditions.  Patient's questions and concerns were addressed to his satisfaction. He voices understanding of the instructions provided during this encounter.   This note was created using a voice recognition software as a result there may be grammatical errors inadvertently enclosed that do not reflect the nature of this encounter. Every attempt is made to correct such errors.  Rex Kras, [place], [facility]  Pager: [phone] Office: [phone]
